# Patient Record
Sex: Male | Born: 2005 | Race: Black or African American | Hispanic: No | Marital: Single
Health system: Southern US, Community
[De-identification: ages and names within clinical notes are randomized; demographics above are authoritative.]

## PROBLEM LIST (undated history)

## (undated) DIAGNOSIS — J45909 Unspecified asthma, uncomplicated: Secondary | ICD-10-CM

---
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
## 2024-04-28 ENCOUNTER — Other Ambulatory Visit: Payer: Self-pay

## 2024-04-28 ENCOUNTER — Encounter (HOSPITAL_BASED_OUTPATIENT_CLINIC_OR_DEPARTMENT_OTHER): Payer: Self-pay

## 2024-04-28 ENCOUNTER — Observation Stay (HOSPITAL_BASED_OUTPATIENT_CLINIC_OR_DEPARTMENT_OTHER)
Admission: EM | Admit: 2024-04-28 | Discharge: 2024-04-30 | Disposition: A | Discharge status: Home / Self Care | Payer: Worker's Compensation | Attending: Internal Medicine | Admitting: Internal Medicine

## 2024-04-28 DIAGNOSIS — T782XXA Anaphylactic shock, unspecified, initial encounter: Principal | ICD-10-CM | POA: Insufficient documentation

## 2024-04-28 DIAGNOSIS — I959 Hypotension, unspecified: Secondary | ICD-10-CM | POA: Insufficient documentation

## 2024-04-28 DIAGNOSIS — Z91010 Allergy to peanuts: Secondary | ICD-10-CM | POA: Diagnosis not present

## 2024-04-28 DIAGNOSIS — T7840XA Allergy, unspecified, initial encounter: Secondary | ICD-10-CM | POA: Diagnosis present

## 2024-04-28 DIAGNOSIS — J45909 Unspecified asthma, uncomplicated: Secondary | ICD-10-CM | POA: Insufficient documentation

## 2024-04-28 LAB — CBC WITH DIFFERENTIAL/PLATELET
Abs Immature Granulocytes: 0.1 K/uL — ABNORMAL HIGH (ref 0.00–0.07)
Basophils Absolute: 0 K/uL (ref 0.0–0.1)
Basophils Relative: 0 %
Eosinophils Absolute: 0.1 K/uL (ref 0.0–0.5)
Eosinophils Relative: 0 %
HCT: 46.9 % (ref 39.0–52.0)
Hemoglobin: 16.5 g/dL (ref 13.0–17.0)
Immature Granulocytes: 1 %
Lymphocytes Relative: 9 %
Lymphs Abs: 1.6 K/uL (ref 0.7–4.0)
MCH: 31 pg (ref 26.0–34.0)
MCHC: 35.2 g/dL (ref 30.0–36.0)
MCV: 88 fL (ref 80.0–100.0)
Monocytes Absolute: 0.4 K/uL (ref 0.1–1.0)
Monocytes Relative: 2 %
Neutro Abs: 16.6 K/uL — ABNORMAL HIGH (ref 1.7–7.7)
Neutrophils Relative %: 88 %
Platelets: 339 K/uL (ref 150–400)
RBC: 5.33 MIL/uL (ref 4.22–5.81)
RDW: 11.9 % (ref 11.5–15.5)
WBC: 18.8 K/uL — ABNORMAL HIGH (ref 4.0–10.5)
nRBC: 0 % (ref 0.0–0.2)

## 2024-04-28 LAB — COMPREHENSIVE METABOLIC PANEL WITH GFR
ALT: 11 U/L (ref 0–44)
AST: 22 U/L (ref 15–41)
Albumin: 4.6 g/dL (ref 3.5–5.0)
Alkaline Phosphatase: 73 U/L (ref 38–126)
Anion gap: 14 (ref 5–15)
BUN: 12 mg/dL (ref 6–20)
CO2: 22 mmol/L (ref 22–32)
Calcium: 9.1 mg/dL (ref 8.9–10.3)
Chloride: 105 mmol/L (ref 98–111)
Creatinine, Ser: 1.13 mg/dL (ref 0.61–1.24)
GFR, Estimated: >60 mL/min (ref >60)
Glucose, Bld: 142 mg/dL — ABNORMAL HIGH (ref 70–99)
Potassium: 3.8 mmol/L (ref 3.5–5.1)
Sodium: 140 mmol/L (ref 135–145)
Total Bilirubin: 0.5 mg/dL (ref 0.0–1.2)
Total Protein: 7.1 g/dL (ref 6.5–8.1)

## 2024-04-28 MED ORDER — EPINEPHRINE 0.3 MG/0.3ML IJ SOAJ
0.3000 mg | Freq: Once | INTRAMUSCULAR | Status: AC
  Administered 2024-04-28: 0.3 mg via INTRAMUSCULAR
  Filled 2024-04-28: qty 0.3

## 2024-04-28 MED ORDER — SODIUM CHLORIDE 0.9 % IV BOLUS
1000.0000 mL | Freq: Once | INTRAVENOUS | Status: AC
  Administered 2024-04-28: 1000 mL via INTRAVENOUS

## 2024-04-28 MED ORDER — FAMOTIDINE 20 MG PO TABS
40.0000 mg | ORAL_TABLET | Freq: Once | ORAL | Status: AC
  Administered 2024-04-28: 40 mg via ORAL
  Filled 2024-04-28: qty 2

## 2024-04-28 MED ORDER — DEXAMETHASONE SODIUM PHOSPHATE 10 MG/ML IJ SOLN
10.0000 mg | Freq: Once | INTRAMUSCULAR | Status: AC
  Administered 2024-04-28: 10 mg via INTRAVENOUS
  Filled 2024-04-28: qty 1

## 2024-04-28 MED ORDER — DIPHENHYDRAMINE HCL 50 MG/ML IJ SOLN
25.0000 mg | Freq: Once | INTRAMUSCULAR | Status: AC
  Administered 2024-04-28: 25 mg via INTRAVENOUS
  Filled 2024-04-28: qty 1

## 2024-04-28 NOTE — Progress Notes (Signed)
 Hospitalist Transfer Note:    Nursing staff, Please call [facility] Admits & Consults System-Wide number on Amion [phone]) as soon as patient's arrival, so appropriate admitting provider can evaluate the pt.   Transferring facility: [facility] Requesting provider: Dr. Lavonia Pat (EDP at [facility]) Reason for transfer: admission for further evaluation and management of allergic reaction.     2 M with history of peanut allergy, who presented to [facility] ED complaining of hives, wheezing, which started shortly after consuming a food with peanut butter paste at work earlier today.  The patient self-administered an EpiPen  while at work, with ensuing improvement in his hives/wheezing before presenting to [facility] for further evaluation.   While at [facility], he subsequently experienced recurrence of his hives and wheezing, which was also associated with an episode of hypotension, which resolved with a single additional dose of epinephrine .  Subsequent to the single dose of epinephrine , he has remained hemodynamically stable.  Presentation is not reported to be associated with any shortness of breath, angioedema, nausea, vomiting, diarrhea.   No reported stridor or any evidence of acute respiratory distress.  Vital signs in the ED were notable for the following: Oxygen saturation is in the high 90s on room air.  Medications administered prior to transfer included the following: Epinephrine  0.3 mg IM x 1 dose, Benadryl  25 mg IV x 1 dose, Pepcid  40 mg p.o. x 1 dose, Decadron  10 mg IV x 1 dose, and a 1 L normal saline bolus.   Subsequently, I accepted this patient for transfer for observation to a sdu bed at [facility] or [facility] (first available) for further work-up and management of the above.     Eva Pore, DO Hospitalist

## 2024-04-28 NOTE — ED Provider Notes (Signed)
 [place] EMERGENCY DEPARTMENT AT [facility] Provider Note   CSN: [id number] Arrival date & time: 04/28/24  1947     Patient presents with: No chief complaint on file.   Manuel Lopez is a 18 y.o. male.   HPI     Patient presents because of allergic reaction.  According to patient, he is allergic to peanuts.  Subsequently ate food at work that had some form of peanut paste on it.  Subsequently started feel like his throat was closing up.  Gave himself epinephrine  about 30 minutes prior to arrival.  Symptoms began around 730.  Subsequently, feeling better baseline now.  No shortness of breath.  No wheezing.  No nausea vomit diarrhea.  No rash.  Feels completely back to baseline right now.  Prior to Admission medications   Medication Sig Start Date End Date Taking? Authorizing Provider  EPINEPHrine  0.3 mg/0.3 mL IJ SOAJ injection Inject into the muscle. Patient not taking: Reported on 07/23/2022 04/07/22   [provider]    Allergies: Peanut-containing drug products, Egg-derived products, and Shellfish allergy    Review of Systems  Constitutional:  Negative for chills and fever.  HENT:  Negative for ear pain and sore throat.   Eyes:  Negative for pain and visual disturbance.  Respiratory:  Negative for cough and shortness of breath.   Cardiovascular:  Negative for chest pain and palpitations.  Gastrointestinal:  Negative for abdominal pain and vomiting.  Genitourinary:  Negative for dysuria and hematuria.  Musculoskeletal:  Negative for arthralgias and back pain.  Skin:  Negative for color change and rash.  Neurological:  Negative for seizures and syncope.  All other systems reviewed and are negative.   Updated Vital Signs BP (!) 136/49   Pulse 92   Temp 98.3 F (36.8 C) (Oral)   Resp 16   SpO2 97%   Physical Exam Vitals and nursing note reviewed.  Constitutional:      General: He is not in acute distress.    Appearance: He is well-developed.   HENT:     Head: Normocephalic and atraumatic.  Eyes:     Conjunctiva/sclera: Conjunctivae normal.  Cardiovascular:     Rate and Rhythm: Normal rate and regular rhythm.     Heart sounds: No murmur heard. Pulmonary:     Effort: Pulmonary effort is normal. No respiratory distress.     Breath sounds: Normal breath sounds.  Abdominal:     Palpations: Abdomen is soft.     Tenderness: There is no abdominal tenderness.  Musculoskeletal:        General: No swelling.     Cervical back: Neck supple.  Skin:    General: Skin is warm and dry.     Capillary Refill: Capillary refill takes less than 2 seconds.  Neurological:     Mental Status: He is alert.  Psychiatric:        Mood and Affect: Mood normal.     (all labs ordered are listed, but only abnormal results are displayed) Labs Reviewed  CBC WITH DIFFERENTIAL/PLATELET - Abnormal; Notable for the following components:      Result Value   WBC 18.8 (*)    Neutro Abs 16.6 (*)    Abs Immature Granulocytes 0.10 (*)    All other components within normal limits  COMPREHENSIVE METABOLIC PANEL WITH GFR - Abnormal; Notable for the following components:   Glucose, Bld 142 (*)    All other components within normal limits    EKG: EKG Interpretation  Date/Time:  Friday April 28 2024 20:12:14 EDT Ventricular Rate:  80 PR Interval:  136 QRS Duration:  83 QT Interval:  345 QTC Calculation: 398 R Axis:   60  Text Interpretation: Sinus rhythm Confirmed by Simon Rea [phone]) on 04/28/2024 8:28:54 PM  Radiology: No results found.   Procedures   Medications Ordered in the ED  diphenhydrAMINE  (BENADRYL ) injection 25 mg (25 mg Intravenous Given 04/28/24 2018)  famotidine  (PEPCID ) tablet 40 mg (40 mg Oral Given 04/28/24 2019)  dexamethasone  (DECADRON ) injection 10 mg (10 mg Intravenous Given 04/28/24 2019)  sodium chloride  0.9 % bolus 1,000 mL (1,000 mLs Intravenous New Bag/Given 04/28/24 2022)  EPINEPHrine  (EPI-PEN) injection 0.3 mg (0.3 mg  Intramuscular Given 04/28/24 2225)                                    Medical Decision Making Amount and/or Complexity of Data Reviewed Labs: ordered.  Risk Prescription drug management.   Patient presents because of allergic reaction.  According to patient, he is allergic to peanuts.  Subsequently ate food at work that had some form of peanut paste on it.  Subsequently started feel like his throat was closing up.  Gave himself epinephrine  about 30 minutes prior to arrival.  Symptoms began around 730.  Subsequently, feeling better baseline now.  No shortness of breath.  No wheezing.  No nausea vomit diarrhea.  No rash.  Feels completely back to baseline right now.   Upon exam, ANO x 3 GCS 15.  Maps appropriate.  On room air 100 patient.  No tachycardia.  No rash.  No evidence of ongoing anaphylaxis.  Patient already received epinephrine .  Will give famotidine , Benadryl , bolus as well as Decadron  and observe.  Given known exposure to allergen, did observe the patient in the ED.  Subsequently had reactivation of anaphylaxis.  Patient had rebound hives, wheezing, hypotension.  Given this, did give another round of IM epinephrine .  Subsequently, all of patient's symptoms resolved.  Given rebound anaphylaxis, patient will be placed in observation overnight at [place].  Patient currently is on room air and hemodynamically stable   CRITICAL CARE Performed by: Rea LOISE Simon   Total critical care time: 35 minutes  Critical care time was exclusive of separately billable procedures and treating other patients.  Critical care was necessary to treat or prevent imminent or life-threatening deterioration.  Critical care was time spent personally by me on the following activities: development of treatment plan with patient and/or surrogate as well as nursing, discussions with consultants, evaluation of patient's response to treatment, examination of patient, obtaining history from patient or  surrogate, ordering and performing treatments and interventions, ordering and review of laboratory studies, ordering and review of radiographic studies, pulse oximetry and re-evaluation of patient's condition.     Final diagnoses:  Anaphylaxis, initial encounter    ED Discharge Orders     None          Simon Rea LOISE, MD 04/28/24 2337

## 2024-04-28 NOTE — ED Triage Notes (Signed)
 Pt advises he was at work, they gave us  some sandwiches, I think I had a peanut butter & jelly one. States he used epi pen at work, I feel better, just a little puffy throat & tight chest.   Allergic to shellfish & peanuts

## 2024-04-29 ENCOUNTER — Encounter (HOSPITAL_BASED_OUTPATIENT_CLINIC_OR_DEPARTMENT_OTHER): Payer: Self-pay | Admitting: Internal Medicine

## 2024-04-29 DIAGNOSIS — T7840XA Allergy, unspecified, initial encounter: Secondary | ICD-10-CM | POA: Diagnosis present

## 2024-04-29 DIAGNOSIS — T782XXA Anaphylactic shock, unspecified, initial encounter: Secondary | ICD-10-CM | POA: Diagnosis present

## 2024-04-29 DIAGNOSIS — Z743 Need for continuous supervision: Secondary | ICD-10-CM | POA: Diagnosis not present

## 2024-04-29 LAB — CBC
HCT: 44.9 % (ref 39.0–52.0)
Hemoglobin: 14.9 g/dL (ref 13.0–17.0)
MCH: 30.5 pg (ref 26.0–34.0)
MCHC: 33.2 g/dL (ref 30.0–36.0)
MCV: 92 fL (ref 80.0–100.0)
Platelets: 271 K/uL (ref 150–400)
RBC: 4.88 MIL/uL (ref 4.22–5.81)
RDW: 11.8 % (ref 11.5–15.5)
WBC: 11.5 K/uL — ABNORMAL HIGH (ref 4.0–10.5)
nRBC: 0 % (ref 0.0–0.2)

## 2024-04-29 LAB — CREATININE, SERUM
Creatinine, Ser: 0.98 mg/dL (ref 0.61–1.24)
GFR, Estimated: >60 mL/min (ref >60)

## 2024-04-29 LAB — MRSA NEXT GEN BY PCR, NASAL: MRSA by PCR Next Gen: NOT DETECTED

## 2024-04-29 LAB — HIV ANTIBODY (ROUTINE TESTING W REFLEX): HIV Screen 4th Generation wRfx: NONREACTIVE

## 2024-04-29 MED ORDER — DIPHENHYDRAMINE HCL 25 MG PO CAPS
25.0000 mg | ORAL_CAPSULE | Freq: Three times a day (TID) | ORAL | Status: DC
  Administered 2024-04-29 – 2024-04-30 (×4): 25 mg via ORAL
  Filled 2024-04-29 (×4): qty 1

## 2024-04-29 MED ORDER — ENOXAPARIN SODIUM 40 MG/0.4ML IJ SOSY
40.0000 mg | PREFILLED_SYRINGE | INTRAMUSCULAR | Status: DC
  Administered 2024-04-29 – 2024-04-30 (×2): 40 mg via SUBCUTANEOUS
  Filled 2024-04-29 (×2): qty 0.4

## 2024-04-29 MED ORDER — PREDNISONE 20 MG PO TABS
20.0000 mg | ORAL_TABLET | Freq: Every day | ORAL | Status: DC
  Administered 2024-04-30: 20 mg via ORAL
  Filled 2024-04-29: qty 1

## 2024-04-29 MED ORDER — EPINEPHRINE 0.3 MG/0.3ML IJ SOAJ: 0.3000 mg | INTRAMUSCULAR | Status: DC | PRN

## 2024-04-29 MED ORDER — FAMOTIDINE 20 MG PO TABS
20.0000 mg | ORAL_TABLET | Freq: Two times a day (BID) | ORAL | Status: DC
  Administered 2024-04-29 – 2024-04-30 (×3): 20 mg via ORAL
  Filled 2024-04-29 (×3): qty 1

## 2024-04-29 NOTE — H&P (Signed)
 History and Physical  Manuel Lopez FMW:[id number] DOB: 2005/12/22 DOA: 04/28/2024  Referring physician: Transferred from [facility]  PCP: Pcp, No  Outpatient Specialists:  Patient coming from: Transferred from [facility]   Chief Complaint: Allergic reaction to peanut  HPI:  Patient is a 18 year old male with past medical history significant for asthma and peanut allergy.  Patient has had to use EpiPen  twice in the past due to severe peanut allergy.  Patient presented to [facility] yesterday with another episode of peanut allergy, following exposure to peanuts.  Patient was hypotensive.  Patient self-administered EpiPen , and was given another dose of EpiPen  at the [facility].  Order management at the [facility] included IV Decadron , IV Pepcid  and IV Benadryl .  Patient was transferred to [facility] for further assessment and management.  Blood pressure is currently stable, without any significant allergic symptoms.  No fever, chills, rash, no headache, neck pain, chest pain, shortness of breath, joint pains, GI symptoms or urinary symptoms.  ED Course: Patient administered EpiPen  prior to presentation, and was administered another dose of EpiPen  at the emergency room.  Patient was also managed with IV Benadryl , Pepcid , IV Decadron  10 mg x 1 dose and 1 L normal saline.  Hypotension has resolved.  Patient has been transferred to [facility] for further management.  Pertinent labs: On presentation, CMP revealed sodium of 140, potassium of 3.8, chloride 105, CO2 22, BUN of 12, serum creatinine of 1.13, blood sugar 142, AST of 22, ALT of 11, WBC of 18.8. EKG: Independently reviewed.    Review of Systems:  As in HPI.   Past Medical History:  Diagnosis Date   Asthma     History reviewed. No pertinent surgical history.   reports that he has never smoked. He has never used smokeless tobacco. No history on file for alcohol use and drug use.  Allergies  Allergen Reactions    Peanut-Containing Drug Products Swelling and Hives   Egg-Derived Products Swelling   Shellfish Allergy Swelling and Rash    History reviewed. No pertinent family history.   Prior to Admission medications   Medication Sig Start Date End Date Taking? Authorizing Provider  EPINEPHrine  0.3 mg/0.3 mL IJ SOAJ injection Inject into the muscle. 04/07/22  Yes [provider]    Physical Exam: Vitals:   04/29/24 0511 04/29/24 0652 04/29/24 0700 04/29/24 0725  BP: 105/65     Pulse: 77  64   Resp: 13  13   Temp: 98 F (36.7 C)   97.8 F (36.6 C)  TempSrc:    Oral  SpO2: 99%  98%   Weight:  54.2 kg    Height:  5' 3 (1.6 m)       Constitutional:  Appears calm and comfortable Eyes:  No pallor. No jaundice.  ENMT:  external ears, nose appear normal Neck:  Neck is supple. No JVD Respiratory:  CTA bilaterally, no w/r/r.  Respiratory effort normal. No retractions or accessory muscle use Cardiovascular:  S1S2 No LE extremity edema   Abdomen:  Abdomen is soft and non tender. Organs are difficult to assess. Neurologic:  Awake and alert. Moves all limbs.  Wt Readings from Last 3 Encounters:  04/29/24 54.2 kg (4%, Z= -1.79)*  07/23/22 50.8 kg (3%, Z= -1.87)*  10/09/21 49 kg (4%, Z= -1.81)*   * Growth percentiles are based on CDC (Boys, 2-20 Years) data.    I have personally reviewed following labs and imaging studies  Labs on Admission:  CBC: Recent Labs  Lab 04/28/24 2225  WBC 18.8*  NEUTROABS 16.6*  HGB 16.5  HCT 46.9  MCV 88.0  PLT 339   Basic Metabolic Panel: Recent Labs  Lab 04/28/24 2225  NA 140  K 3.8  CL 105  CO2 22  GLUCOSE 142*  BUN 12  CREATININE 1.13  CALCIUM 9.1   Liver Function Tests: Recent Labs  Lab 04/28/24 2225  AST 22  ALT 11  ALKPHOS 73  BILITOT 0.5  PROT 7.1  ALBUMIN 4.6   No results for input(s): LIPASE, AMYLASE in the last 168 hours. No results for input(s): AMMONIA in the last 168 hours. Coagulation  Profile: No results for input(s): INR, PROTIME in the last 168 hours. Cardiac Enzymes: No results for input(s): CKTOTAL, CKMB, CKMBINDEX, TROPONINI in the last 168 hours. BNP (last 3 results) No results for input(s): PROBNP in the last 8760 hours. HbA1C: No results for input(s): HGBA1C in the last 72 hours. CBG: No results for input(s): GLUCAP in the last 168 hours. Lipid Profile: No results for input(s): CHOL, HDL, LDLCALC, TRIG, CHOLHDL, LDLDIRECT in the last 72 hours. Thyroid Function Tests: No results for input(s): TSH, T4TOTAL, FREET4, T3FREE, THYROIDAB in the last 72 hours. Anemia Panel: No results for input(s): VITAMINB12, FOLATE, FERRITIN, TIBC, IRON, RETICCTPCT in the last 72 hours. Urine analysis: No results found for: COLORURINE, APPEARANCEUR, LABSPEC, PHURINE, GLUCOSEU, HGBUR, BILIRUBINUR, KETONESUR, PROTEINUR, UROBILINOGEN, NITRITE, LEUKOCYTESUR Sepsis Labs: @LABRCNTIP (procalcitonin:4,lacticidven:4) )No results found for this or any previous visit (from the past 240 hours).    Radiological Exams on Admission: No results found.  EKG: Independently reviewed.   Principal Problem:   Allergic reaction   Assessment/Plan Severe peanut allergy: - Patient was inadvertently exposed to peanut yesterday. - Subsequently, patient developed severe allergic reaction, including severe hypotension. - Patient self administered 1 dose of IM epinephrine , before presenting to the ER. - During patient's stay at the ER, patient experienced another allergic reaction with hypotension and another dose of epinephrine  was administered. - Other management done in the ER includes IV Benadryl , Decadron  and Pepcid .  Patient was also volume resuscitated. - Patient has been transferred to the hospital for further management. - Start oral steroids, Benadryl  and Pepcid . - IM EpiPen  as needed. - Avoid peanuts. - Further  management will depend on hospital course.  Hypotension: - See above documentation. - Resolved.  History of asthma: - Stable.    DVT prophylaxis: Subcutaneous Lovenox  Code Status: Full code Family Communication: Mother by bedside Disposition Plan: Home eventually Consults called: None Admission status: Observation  Time spent: 65 minutes.   Leatrice Chapel, MD  [facility] Pager #: [phone] 7PM-7AM contact night coverage as above  04/29/2024, 10:49 AM

## 2024-04-29 NOTE — ED Notes (Signed)
-  Called [facility] at 1157pm for transportation to [facility].

## 2024-04-29 NOTE — ED Notes (Signed)
 Pt denies any anaphylaxis symptoms. Pt denies cp, sob,hives. Pt is resting at this time.

## 2024-04-29 NOTE — ED Notes (Signed)
 Report called and given to inpatient RN

## 2024-04-29 NOTE — Plan of Care (Signed)
  Problem: Nutrition: Goal: Adequate nutrition will be maintained Outcome: Progressing   Problem: Coping: Goal: Level of anxiety will decrease Outcome: Progressing   Problem: Elimination: Goal: Will not experience complications related to bowel motility Outcome: Progressing   Problem: Pain Managment: Goal: General experience of comfort will improve and/or be controlled Outcome: Progressing   Problem: Safety: Goal: Ability to remain free from injury will improve Outcome: Progressing   Problem: Skin Integrity: Goal: Risk for impaired skin integrity will decrease Outcome: Progressing

## 2024-04-30 DIAGNOSIS — T7840XD Allergy, unspecified, subsequent encounter: Secondary | ICD-10-CM

## 2024-04-30 LAB — BASIC METABOLIC PANEL WITH GFR
Anion gap: 14 (ref 5–15)
BUN: 16 mg/dL (ref 6–20)
CO2: 24 mmol/L (ref 22–32)
Calcium: 9.3 mg/dL (ref 8.9–10.3)
Chloride: 105 mmol/L (ref 98–111)
Creatinine, Ser: 1.1 mg/dL (ref 0.61–1.24)
GFR, Estimated: >60 mL/min (ref >60)
Glucose, Bld: 91 mg/dL (ref 70–99)
Potassium: 3.8 mmol/L (ref 3.5–5.1)
Sodium: 142 mmol/L (ref 135–145)

## 2024-04-30 LAB — CBC
HCT: 44.4 % (ref 39.0–52.0)
Hemoglobin: 14.5 g/dL (ref 13.0–17.0)
MCH: 30.1 pg (ref 26.0–34.0)
MCHC: 32.7 g/dL (ref 30.0–36.0)
MCV: 92.1 fL (ref 80.0–100.0)
Platelets: 274 K/uL (ref 150–400)
RBC: 4.82 MIL/uL (ref 4.22–5.81)
RDW: 12 % (ref 11.5–15.5)
WBC: 11.1 K/uL — ABNORMAL HIGH (ref 4.0–10.5)
nRBC: 0 % (ref 0.0–0.2)

## 2024-04-30 MED ORDER — EPINEPHRINE 0.3 MG/0.3ML IJ SOAJ: 0.3000 mg | INTRAMUSCULAR | 1 refills | Status: AC | PRN

## 2024-04-30 MED ORDER — DIPHENHYDRAMINE HCL 25 MG PO CAPS: 25.0000 mg | ORAL_CAPSULE | Freq: Four times a day (QID) | ORAL | 0 refills | Status: AC | PRN

## 2024-04-30 MED ORDER — FAMOTIDINE 20 MG PO TABS: 20.0000 mg | ORAL_TABLET | Freq: Two times a day (BID) | ORAL | 0 refills | Status: AC

## 2024-04-30 NOTE — Discharge Summary (Signed)
 Physician Discharge Summary  Patient ID: Manuel Lopez MRN: [id number] DOB/AGE: 03/17/2006 18 y.o.  Admit date: 04/28/2024 Discharge date: 04/30/2024  Admission Diagnoses:  Discharge Diagnoses:  Principal Problem:   Allergic reaction   Discharged Condition: {condition:18240}  Hospital Course: ***  Consults: {consultation:18241}  Significant Diagnostic Studies: {diagnostics:18242}  Treatments: {Tx:18249}  Discharge Exam: Blood pressure (!) 109/53, pulse 65, temperature 98.2 F (36.8 C), temperature source Oral, resp. rate 18, height 5' 3 (1.6 m), weight 50.7 kg, SpO2 98%. {physical zkjf:6958869}  Disposition: Discharge disposition: 01-Home or Self Care       Discharge Instructions     Diet - low sodium heart healthy   Complete by: As directed    Increase activity slowly   Complete by: As directed       Allergies as of 04/30/2024       Reactions   Peanut-containing Drug Products Swelling, Hives   Egg-derived Products Swelling   Shellfish Allergy Swelling, Rash        Medication List     TAKE these medications    diphenhydrAMINE  25 mg capsule Commonly known as: BENADRYL  Take 1 capsule (25 mg total) by mouth every 6 (six) hours as needed for up to 5 days.   EPINEPHrine  0.3 mg/0.3 mL Soaj injection Commonly known as: EPI-PEN Inject 0.3 mg into the muscle as needed for anaphylaxis. What changed:  how much to take when to take this reasons to take this   famotidine  20 MG tablet Commonly known as: PEPCID  Take 1 tablet (20 mg total) by mouth 2 (two) times daily for 5 days.         SignedBETHA Leatrice LILLETTE Rosario 04/30/2024, 2:24 PM

## 2024-04-30 NOTE — Plan of Care (Signed)

## 2024-04-30 NOTE — TOC Initial Note (Signed)
 Transition of Care [facility]) - Initial/Assessment Note    Patient Details  Name: Manuel Lopez MRN: [id number] Date of Birth: 04-09-2006  Transition of Care [facility]) CM/SW Contact:    Sonda Manuella Quill, RN Phone Number: 04/30/2024, 3:26 PM  Clinical Narrative:                 No PCP listed; spoke w/ pt; he said he lives at home w/ his mother Manuel Lopez [phone]); he plans to return at d/c; his mother will provide transportation; pt verified insurance; he declined receiving resource for [facility] PCPs; pt said his mother will arrange PCP for him; he denied SDOH risks; pt does not have DME, HH services, or home oxygen; no TOC needs.  Expected Discharge Plan: Home/Self Care Barriers to Discharge: No Barriers Identified   Patient Goals and CMS Choice Patient states their goals for this hospitalization and ongoing recovery are:: home          Expected Discharge Plan and Services   Discharge Planning Services: CM Consult   Living arrangements for the past 2 months: Single Family Home Expected Discharge Date: 04/30/24               DME Arranged: N/A DME Agency: NA       HH Arranged: NA HH Agency: NA        Prior Living Arrangements/Services Living arrangements for the past 2 months: Single Family Home Lives with:: Parents Patient language and need for interpreter reviewed:: Yes Do you feel safe going back to the place where you live?: Yes      Need for Family Participation in Patient Care: Yes (Comment) Care giver support system in place?: Yes (comment) Current home services:  (n/a) Criminal Activity/Legal Involvement Pertinent to Current Situation/Hospitalization: No - Comment as needed  Activities of Daily Living   ADL Screening (condition at time of admission) Independently performs ADLs?: Yes (appropriate for developmental age) Is the patient deaf or have difficulty hearing?: No Does the patient have difficulty seeing, even when wearing glasses/contacts?:  No Does the patient have difficulty concentrating, remembering, or making decisions?: No  Permission Sought/Granted Permission sought to share information with : Case Manager Permission granted to share information with : Yes, Verbal Permission Granted  Share Information with NAME: Case Manager     Permission granted to share info w Relationship: Meta Ma (mother) [phone]     Emotional Assessment Appearance:: Appears stated age Attitude/Demeanor/Rapport: Gracious Affect (typically observed): Accepting Orientation: : Oriented to Self, Oriented to Place, Oriented to  Time, Oriented to Situation Alcohol / Substance Use: Not Applicable Psych Involvement: No (comment)  Admission diagnosis:  Allergic reaction [T78.40XA] Anaphylaxis, initial encounter [T78.2XXA] Patient Active Problem List   Diagnosis Date Noted   Allergic reaction 04/28/2024   PCP:  Pcp, No Pharmacy:   [facility] [address]), [place] - [address] [place] ([place]) [place] [address] Phone: [phone] Fax: [phone]     Social Drivers of Health (SDOH) Social History: SDOH Screenings   Food Insecurity: No Food Insecurity (04/30/2024)  Housing: Low Risk  (04/30/2024)  Transportation Needs: No Transportation Needs (04/30/2024)  Utilities: Not At Risk (04/30/2024)  Tobacco Use: Low Risk  (04/29/2024)   SDOH Interventions: Food Insecurity Interventions: Intervention Not Indicated, Inpatient TOC Housing Interventions: Intervention Not Indicated, Inpatient TOC Transportation Interventions: Intervention Not Indicated, Inpatient TOC Utilities Interventions: Intervention Not Indicated, Inpatient TOC   Readmission Risk Interventions     No data to display
